# Patient Record
Sex: Male | Born: 1970 | Race: White | Hispanic: No | Marital: Married | Smoking: Never smoker
Health system: Southern US, Community
[De-identification: ages and names within clinical notes are randomized; demographics above are authoritative.]

## PROBLEM LIST (undated history)

## (undated) DIAGNOSIS — N2889 Other specified disorders of kidney and ureter: Secondary | ICD-10-CM

## (undated) DIAGNOSIS — C801 Malignant (primary) neoplasm, unspecified: Secondary | ICD-10-CM

## (undated) DIAGNOSIS — I10 Essential (primary) hypertension: Secondary | ICD-10-CM

## (undated) DIAGNOSIS — K219 Gastro-esophageal reflux disease without esophagitis: Secondary | ICD-10-CM

## (undated) DIAGNOSIS — G8921 Chronic pain due to trauma: Secondary | ICD-10-CM

## (undated) DIAGNOSIS — M1990 Unspecified osteoarthritis, unspecified site: Secondary | ICD-10-CM

## (undated) DIAGNOSIS — N319 Neuromuscular dysfunction of bladder, unspecified: Secondary | ICD-10-CM

## (undated) HISTORY — PX: URETHROPLASTY: SHX499

## (undated) HISTORY — PX: KIDNEY SURGERY: SHX687

---

## 2013-04-11 LAB — URINALYSIS, COMPLETE
Bilirubin,UR: NEGATIVE
Nitrite: NEGATIVE
Ph: 6 (ref 4.5–8.0)
Protein: NEGATIVE
RBC,UR: <1 /HPF (ref 0–5)
Specific Gravity: 1.006 (ref 1.003–1.030)
WBC UR: 36 /HPF (ref 0–5)

## 2013-04-11 LAB — CBC WITH DIFFERENTIAL/PLATELET
Eosinophil #: 0 10*3/uL (ref 0.0–0.7)
Eosinophil %: 0 %
HCT: 43.7 % (ref 40.0–52.0)
HGB: 14.9 g/dL (ref 13.0–18.0)
Lymphocyte #: 1 10*3/uL (ref 1.0–3.6)
MCH: 29.8 pg (ref 26.0–34.0)
MCV: 87 fL (ref 80–100)
Monocyte #: 1.4 x10 3/mm — ABNORMAL HIGH (ref 0.2–1.0)
Monocyte %: 5 %
Platelet: 223 10*3/uL (ref 150–440)
RBC: 5.02 10*6/uL (ref 4.40–5.90)
RDW: 13.4 % (ref 11.5–14.5)
WBC: 28.6 10*3/uL — ABNORMAL HIGH (ref 3.8–10.6)

## 2013-04-11 LAB — COMPREHENSIVE METABOLIC PANEL
Albumin: 3.9 g/dL (ref 3.4–5.0)
Alkaline Phosphatase: 124 U/L (ref 50–136)
Chloride: 99 mmol/L (ref 98–107)
Creatinine: 1.08 mg/dL (ref 0.60–1.30)
EGFR (African American): >60
EGFR (Non-African Amer.): >60
Glucose: 128 mg/dL — ABNORMAL HIGH (ref 65–99)
Osmolality: 265 (ref 275–301)
SGOT(AST): 27 U/L (ref 15–37)
SGPT (ALT): 39 U/L (ref 12–78)

## 2013-04-12 ENCOUNTER — Inpatient Hospital Stay: Payer: Self-pay | Admitting: Urology

## 2013-04-12 LAB — CBC WITH DIFFERENTIAL/PLATELET
Basophil #: 0.1 10*3/uL (ref 0.0–0.1)
Basophil %: 0.3 %
Eosinophil %: 0.5 %
HCT: 43.4 % (ref 40.0–52.0)
MCHC: 34.4 g/dL (ref 32.0–36.0)
MCV: 87 fL (ref 80–100)
Monocyte #: 1.3 x10 3/mm — ABNORMAL HIGH (ref 0.2–1.0)
Monocyte %: 6.4 %
Neutrophil #: 16.9 10*3/uL — ABNORMAL HIGH (ref 1.4–6.5)
Neutrophil %: 85.2 %
RDW: 13.5 % (ref 11.5–14.5)
WBC: 19.8 10*3/uL — ABNORMAL HIGH (ref 3.8–10.6)

## 2013-04-13 LAB — URINE CULTURE

## 2013-04-17 LAB — CULTURE, BLOOD (SINGLE)

## 2014-05-30 ENCOUNTER — Ambulatory Visit: Payer: Self-pay | Admitting: Urology

## 2014-06-21 ENCOUNTER — Emergency Department: Payer: Self-pay | Admitting: Emergency Medicine

## 2014-06-21 LAB — CBC
HCT: 42.9 % (ref 40.0–52.0)
HGB: 14.7 g/dL (ref 13.0–18.0)
MCH: 30.7 pg (ref 26.0–34.0)
MCHC: 34.3 g/dL (ref 32.0–36.0)
MCV: 90 fL (ref 80–100)
PLATELETS: 230 10*3/uL (ref 150–440)
RBC: 4.79 10*6/uL (ref 4.40–5.90)
RDW: 13.6 % (ref 11.5–14.5)
WBC: 9.2 10*3/uL (ref 3.8–10.6)

## 2014-06-21 LAB — BASIC METABOLIC PANEL
Anion Gap: 10 (ref 7–16)
BUN: 12 mg/dL (ref 7–18)
CALCIUM: 8.6 mg/dL (ref 8.5–10.1)
CREATININE: 1 mg/dL (ref 0.60–1.30)
Chloride: 105 mmol/L (ref 98–107)
Co2: 25 mmol/L (ref 21–32)
EGFR (African American): >60
EGFR (Non-African Amer.): >60
GLUCOSE: 109 mg/dL — AB (ref 65–99)
Osmolality: 280 (ref 275–301)
POTASSIUM: 3.6 mmol/L (ref 3.5–5.1)
SODIUM: 140 mmol/L (ref 136–145)

## 2014-06-21 LAB — TROPONIN I: Troponin-I: <0.02 ng/mL

## 2015-02-21 NOTE — Discharge Summary (Signed)
PATIENT NAME:  Austin Jennings, Austin Jennings MR#:  [id number] DATE OF BIRTH:  [birth year]  DATE OF ADMISSION:  04/12/2013 DATE OF DISCHARGE:  04/13/2013  ADMISSION DIAGNOSIS: Right epididymitis.  HISTORY OF PRESENT ILLNESS: A 44 year old male, who presented to the Emergency Department for a 12 hour history of right testicular pain, swelling and fever of 102 degrees. There is a previous history of recurrent epididymitis. Refer to the admission history and physical for details.   HOSPITAL COURSE: Scrotal sonogram was consistent with epididymo-orchitis. There was no evidence of abscess. He was started on IV Rocephin and anti-inflammatories. In the first 24 hours, his maximum temperature was 99.1. On the second hospital day, he had marked decrease and his testicular tenderness. WBC on admission was 28,000 and on the second hospital day, it was 19,000 and 10.6 thousand on the day of discharge. On the third postoperative day, he had been afebrile for 24 hours. His pain was markedly improved. Urine culture was growing 15,000 gram-negative rods.   DISPOSITION: The patient is discharged with instructions of no strenuous activity for 2 weeks. He was instructed to contact the office for increasing pain or recurrent fever greater than 101 degrees,  DISCHARGE MEDICATIONS:  Cefuroxime 500 mg p.o. b.i.d. x 21 days and Norco 5/325, 1 to 2 p.o. q. 4 to 6 hours p.r.n. pain.   DISCHARGE DIAGNOSIS: Right epididymitis.   CONDITION AT DISCHARGE: Good.   PROGNOSIS: Good.  FOLLOWUP: [facility], [place] in 3 weeks.  ____________________________ Ronda Fairly Bernardo Heater, MD scs:aw D: 04/13/2013 08:05:00 ET T: 04/13/2013 08:30:03 ET JOB#: [id number]  cc: Nicki Reaper C. Bernardo Heater, MD, <Dictator> Abbie Sons MD ELECTRONICALLY SIGNED 04/25/2013 13:18

## 2015-02-21 NOTE — Discharge Summary (Signed)
PATIENT NAME:  Austin Jennings, Austin Jennings MR#:  [id number] DATE OF BIRTH:  02-13-1971  DATE OF ADMISSION:  04/12/2013 DATE OF DISCHARGE:    ADMISSION DIAGNOSIS: Right epididymitis.  HISTORY OF PRESENT ILLNESS: A 44 year old male, who presented to the Emergency Department for a 12 hour history of right testicular pain, swelling and fever of 102 degrees. There is a previous history of recurrent epididymitis. Refer to the admission history and physical for details.   HOSPITAL COURSE: Scrotal sonogram was consistent with epididymo-orchitis. There was no evidence of abscess. He was started on IV Rocephin and anti-inflammatories. In the first 24 hours, his maximum temperature was 99.1. On the second hospital day, he had marked decrease and his testicular tenderness. WBC on admission was 28,000 and on the second hospital day, it was 19,000 and 10.6 thousand on the day of discharge. On the third postoperative day, he had been afebrile for 24 hours. His pain was markedly improved. Urine culture was growing 15,000 gram-negative rods.   DISPOSITION: The patient is discharged with instructions of no strenuous activity for 2 weeks. He was instructed to contact the office for increasing pain or recurrent fever greater than 101 degrees,  DISCHARGE MEDICATIONS:  Cefuroxime 500 mg p.o. b.i.d. x 21 days and Norco 5/325, 1 to 2 p.o. q. 4 to 6 hours p.r.n. pain.   DISCHARGE DIAGNOSIS: Right epididymitis.   CONDITION AT DISCHARGE: Good.   PROGNOSIS: Good.  FOLLOWUP: [facility], [place] in 3 weeks.  ____________________________ Ronda Fairly Bernardo Heater, MD scs:aw D: 04/13/2013 08:05:36 ET T: 04/13/2013 08:30:03 ET JOB#: [id number]  cc: Nicki Reaper C. Bernardo Heater, MD, <Dictator>
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
## 2017-09-21 ENCOUNTER — Emergency Department: Payer: 59

## 2017-09-21 ENCOUNTER — Other Ambulatory Visit: Payer: Self-pay

## 2017-09-21 ENCOUNTER — Encounter: Payer: Self-pay | Admitting: Emergency Medicine

## 2017-09-21 ENCOUNTER — Emergency Department
Admission: EM | Admit: 2017-09-21 | Discharge: 2017-09-21 | Disposition: A | Discharge status: Home / Self Care | Payer: 59 | Attending: Emergency Medicine | Admitting: Emergency Medicine

## 2017-09-21 DIAGNOSIS — N3000 Acute cystitis without hematuria: Secondary | ICD-10-CM

## 2017-09-21 DIAGNOSIS — N453 Epididymo-orchitis: Secondary | ICD-10-CM | POA: Diagnosis not present

## 2017-09-21 DIAGNOSIS — N5089 Other specified disorders of the male genital organs: Secondary | ICD-10-CM

## 2017-09-21 DIAGNOSIS — I10 Essential (primary) hypertension: Secondary | ICD-10-CM | POA: Diagnosis not present

## 2017-09-21 DIAGNOSIS — N50812 Left testicular pain: Secondary | ICD-10-CM | POA: Diagnosis present

## 2017-09-21 DIAGNOSIS — N50819 Testicular pain, unspecified: Secondary | ICD-10-CM

## 2017-09-21 HISTORY — DX: Other specified disorders of kidney and ureter: N28.89

## 2017-09-21 HISTORY — DX: Essential (primary) hypertension: I10

## 2017-09-21 LAB — URINALYSIS, COMPLETE (UACMP) WITH MICROSCOPIC
Bilirubin Urine: NEGATIVE
Glucose, UA: NEGATIVE mg/dL
Ketones, ur: NEGATIVE mg/dL
Nitrite: POSITIVE — AB
PROTEIN: 100 mg/dL — AB
SQUAMOUS EPITHELIAL / LPF: NONE SEEN
Specific Gravity, Urine: 1.02 (ref 1.005–1.030)
pH: 5 (ref 5.0–8.0)

## 2017-09-21 MED ORDER — OXYCODONE-ACETAMINOPHEN 5-325 MG PO TABS: 1.0000 | ORAL_TABLET | ORAL | 0 refills | Status: DC | PRN

## 2017-09-21 MED ORDER — OXYCODONE-ACETAMINOPHEN 5-325 MG PO TABS
1.0000 | ORAL_TABLET | Freq: Once | ORAL | Status: AC
  Administered 2017-09-21: 1 via ORAL
  Filled 2017-09-21: qty 1

## 2017-09-21 MED ORDER — LEVOFLOXACIN 500 MG PO TABS: 500.0000 mg | ORAL_TABLET | Freq: Every day | ORAL | 0 refills | Status: AC

## 2017-09-21 MED ORDER — KETOROLAC TROMETHAMINE 10 MG PO TABS: 10.0000 mg | ORAL_TABLET | Freq: Four times a day (QID) | ORAL | 0 refills | Status: DC | PRN

## 2017-09-21 MED ORDER — LEVOFLOXACIN 500 MG PO TABS
500.0000 mg | ORAL_TABLET | Freq: Once | ORAL | Status: AC
  Administered 2017-09-21: 500 mg via ORAL
  Filled 2017-09-21: qty 1

## 2017-09-21 NOTE — ED Provider Notes (Signed)
[facility] Emergency Department Provider Note    First MD Initiated Contact with Patient 09/21/17 [phone]     (approximate)  I have reviewed the triage vital signs and the nursing notes.   HISTORY  Chief Complaint Testicle Pain   HPI VEDH PTACEK is a 46 y.o. male with history of right epididymitis with right epididymis surgical removal presents to the emergency department with left testicular pain and swelling that is currently 7 out of 10 following "sitting on his testicle on Sunday.  Patient states the pain has been persistent and worsened today.  Patient denies any fever.  Patient denies any dysuria.  Patient states symptoms consistent with previous episode of epididymitis.   Past Medical History:  Diagnosis Date  . Hypertension   . Renal mass     There are no active problems to display for this patient.   Past Surgical History:  Procedure Laterality Date  . KIDNEY SURGERY Left    removal  . URETHROPLASTY      Prior to Admission medications   Medication Sig Start Date End Date Taking? Authorizing Provider  ketorolac (TORADOL) 10 MG tablet Take 1 tablet (10 mg total) by mouth every 6 (six) hours as needed. 09/21/17   Gregor Hams, MD  levofloxacin (LEVAQUIN) 500 MG tablet Take 1 tablet (500 mg total) by mouth daily for 7 days. 09/21/17 09/28/17  Gregor Hams, MD    Allergies No known drug allergies No family history on file.  Social History Social History   Tobacco Use  . Smoking status: Not on file  Substance Use Topics  . Alcohol use: Not on file  . Drug use: Not on file    Review of Systems Constitutional: No fever/chills Eyes: No visual changes. ENT: No sore throat. Cardiovascular: Denies chest pain. Respiratory: Denies shortness of breath. Gastrointestinal: No abdominal pain.  No nausea, no vomiting.  No diarrhea.  No constipation. Genitourinary: Negative for dysuria.  Positive for left testicular  pain Musculoskeletal: Negative for neck pain.  Negative for back pain. Integumentary: Negative for rash. Neurological: Negative for headaches, focal weakness or numbness.   ____________________________________________   PHYSICAL EXAM:  VITAL SIGNS: ED Triage Vitals  Enc Vitals Group     BP 09/21/17 0219 (!) 162/100     Pulse Rate 09/21/17 0219 96     Resp 09/21/17 0219 20     Temp 09/21/17 0219 99.8 F (37.7 C)     Temp Source 09/21/17 0219 Oral     SpO2 09/21/17 0219 97 %     Weight 09/21/17 0217 (!) 149.7 kg (330 lb)     Height 09/21/17 0217 1.854 m (6\' 1" )     Head Circumference --      Peak Flow --      Pain Score 09/21/17 0217 5     Pain Loc --      Pain Edu? --      Excl. in [place]? --     Constitutional: Alert and oriented. Well appearing and in no acute distress. Eyes: Conjunctivae are normal.  Head: Atraumatic. Mouth/Throat: Mucous membranes are moist. Oropharynx non-erythematous. Neck: No stridor.   Cardiovascular: Normal rate, regular rhythm. Good peripheral circulation. Grossly normal heart sounds. Respiratory: Normal respiratory effort.  No retractions. Lungs CTAB. Gastrointestinal: Soft and nontender. No distention.   Genitourinary: Left testicular swelling and tenderness to palpation. Musculoskeletal: No lower extremity tenderness nor edema. No gross deformities of extremities. Neurologic:  Normal speech and language. No  gross focal neurologic deficits are appreciated.  Skin:  Skin is warm, dry and intact. No rash noted.   ____________________________________________   LABS (all labs ordered are listed, but only abnormal results are displayed)  Labs Reviewed  URINALYSIS, COMPLETE (UACMP) WITH MICROSCOPIC - Abnormal; Notable for the following components:      Result Value   Color, Urine YELLOW (*)    APPearance HAZY (*)    Hgb urine dipstick SMALL (*)    Protein, ur 100 (*)    Nitrite POSITIVE (*)    Leukocytes, UA LARGE (*)    Bacteria, UA MANY  (*)    All other components within normal limits    RADIOLOGY I, McKittrick N Malcomb Gangemi, personally viewed and evaluated these images (plain radiographs) as part of my medical decision making, as well as reviewing the written report by the radiologist.  US Scrotum  Result Date: 09/21/2017 CLINICAL DATA:  Scrotal swelling for 3 days EXAM: SCROTAL ULTRASOUND DOPPLER ULTRASOUND OF THE TESTICLES TECHNIQUE: Complete ultrasound examination of the testicles, epididymis, and other scrotal structures was performed. Color and spectral Doppler ultrasound were also utilized to evaluate blood flow to the testicles. COMPARISON:  None. FINDINGS: Right testicle Measurements: 4.3 x 2.2 x 3.5 cm. Parenchymal echotexture is somewhat heterogeneous likely representing prominent Reedy testis. Old injury and scarring could also have this appearance. No discrete mass lesion. Homogeneous flow with color flow Doppler. Left testicle Measurements: 4.1 x 3.3 x 3.3 cm. No mass or microlithiasis visualized. Right epididymis:  Surgically absent. Left epididymis:  Small epididymal cysts or spermatoceles. Hydrocele:  Small left hydrocele. Varicocele:  Small left varicocele. Pulsed Doppler interrogation of both testes demonstrates normal low resistance arterial and venous waveforms bilaterally. Imaging obtained of the inguinal canals demonstrates fat in the upper left scrotum. No motion with Valsalva. No definite herniation. This could represent herniated fat, normal fatty prominence, or lipoma. IMPRESSION: 1. No evidence of testicular mass, torsion, or inflammation. 2. Small left hydrocele and small left varicocele. 3. Nonspecific fat demonstrated in the left inguinal canal. Electronically Signed   By: Lucienne Capers M.D.   On: 09/21/2017 03:40   [place] Scrotom Doppler  Result Date: 09/21/2017 CLINICAL DATA:  Scrotal swelling for 3 days EXAM: SCROTAL ULTRASOUND DOPPLER ULTRASOUND OF THE TESTICLES TECHNIQUE: Complete ultrasound examination of  the testicles, epididymis, and other scrotal structures was performed. Color and spectral Doppler ultrasound were also utilized to evaluate blood flow to the testicles. COMPARISON:  None. FINDINGS: Right testicle Measurements: 4.3 x 2.2 x 3.5 cm. Parenchymal echotexture is somewhat heterogeneous likely representing prominent Reedy testis. Old injury and scarring could also have this appearance. No discrete mass lesion. Homogeneous flow with color flow Doppler. Left testicle Measurements: 4.1 x 3.3 x 3.3 cm. No mass or microlithiasis visualized. Right epididymis:  Surgically absent. Left epididymis:  Small epididymal cysts or spermatoceles. Hydrocele:  Small left hydrocele. Varicocele:  Small left varicocele. Pulsed Doppler interrogation of both testes demonstrates normal low resistance arterial and venous waveforms bilaterally. Imaging obtained of the inguinal canals demonstrates fat in the upper left scrotum. No motion with Valsalva. No definite herniation. This could represent herniated fat, normal fatty prominence, or lipoma. IMPRESSION: 1. No evidence of testicular mass, torsion, or inflammation. 2. Small left hydrocele and small left varicocele. 3. Nonspecific fat demonstrated in the left inguinal canal. Electronically Signed   By: Lucienne Capers M.D.   On: 09/21/2017 03:40     Procedures   ____________________________________________   INITIAL IMPRESSION / ASSESSMENT AND  PLAN / ED COURSE  As part of my medical decision making, I reviewed the following data within the electronic MEDICAL RECORD [id number] year old male presented with above-stated history and physical exam secondary to left testicular pain.  Suspect possible left traumatic epididymoorchitis.  Ultrasound revealed no evidence of mass torsion or inflammation as per radiologist however clinical exam consistent with possible epididymal orchitis.  Urinalysis consistent with a urinary tract infection patient given Levaquin in the emergency  department will be prescribed the same for home ____________________________________________  FINAL CLINICAL IMPRESSION(S) / ED DIAGNOSES  Final diagnoses:  Testicle pain  Scrotum swelling     MEDICATIONS GIVEN DURING THIS VISIT:  Medications - No data to display   ED Discharge Orders        Ordered    levofloxacin (LEVAQUIN) 500 MG tablet  Daily     09/21/17 0403    ketorolac (TORADOL) 10 MG tablet  Every 6 hours PRN     09/21/17 0403       Note:  This document was prepared using Dragon voice recognition software and may include unintentional dictation errors.    Gregor Hams, MD 09/21/17 [phone]

## 2017-09-21 NOTE — ED Triage Notes (Signed)
Patient ambulatory to triage with steady gait, without difficulty or distress noted; pt reports left testicular swelling/pain since Sunday after sitting on it; st hx of epidymitis

## 2017-09-21 NOTE — ED Notes (Signed)
Received a call from pt pharmacy stating pt just filled a Rx for hydrocodone that he gets monthly and was concerned that we Rx percocet on top of his normal pain medication, consulted with Dr. Archie Balboa about the concerns and he stated to cancel the Rx of percocet that was Rx today.

## 2021-05-26 ENCOUNTER — Other Ambulatory Visit: Payer: Self-pay | Admitting: Unknown Physician Specialty

## 2021-05-26 DIAGNOSIS — D110 Benign neoplasm of parotid gland: Secondary | ICD-10-CM

## 2021-06-02 ENCOUNTER — Other Ambulatory Visit: Payer: Self-pay

## 2021-06-02 ENCOUNTER — Ambulatory Visit
Admission: RE | Admit: 2021-06-02 | Discharge: 2021-06-02 | Disposition: A | Discharge status: Home / Self Care | Payer: Managed Care, Other (non HMO) | Source: Ambulatory Visit | Attending: Unknown Physician Specialty | Admitting: Unknown Physician Specialty

## 2021-06-02 DIAGNOSIS — D110 Benign neoplasm of parotid gland: Secondary | ICD-10-CM | POA: Insufficient documentation

## 2021-06-02 LAB — POCT I-STAT CREATININE: Creatinine, Ser: 1 mg/dL (ref 0.61–1.24)

## 2021-06-02 MED ORDER — IOHEXOL 350 MG/ML SOLN
75.0000 mL | Freq: Once | INTRAVENOUS | Status: AC | PRN
  Administered 2021-06-02: 75 mL via INTRAVENOUS

## 2021-06-04 ENCOUNTER — Ambulatory Visit: Admission: RE | Admit: 2021-06-04 | Payer: 59 | Source: Ambulatory Visit

## 2021-06-09 ENCOUNTER — Other Ambulatory Visit: Payer: Self-pay | Admitting: Unknown Physician Specialty

## 2021-06-09 DIAGNOSIS — K118 Other diseases of salivary glands: Secondary | ICD-10-CM

## 2021-06-17 ENCOUNTER — Other Ambulatory Visit: Payer: Self-pay | Admitting: Radiology

## 2021-06-18 ENCOUNTER — Ambulatory Visit
Admission: RE | Admit: 2021-06-18 | Discharge: 2021-06-18 | Disposition: A | Discharge status: Home / Self Care | Payer: Managed Care, Other (non HMO) | Source: Ambulatory Visit | Attending: Unknown Physician Specialty | Admitting: Unknown Physician Specialty

## 2021-06-18 ENCOUNTER — Ambulatory Visit: Admission: RE | Admit: 2021-06-18 | Payer: Managed Care, Other (non HMO) | Source: Ambulatory Visit

## 2021-06-18 ENCOUNTER — Other Ambulatory Visit: Payer: Self-pay

## 2021-06-18 DIAGNOSIS — D490 Neoplasm of unspecified behavior of digestive system: Secondary | ICD-10-CM | POA: Insufficient documentation

## 2021-06-18 DIAGNOSIS — K118 Other diseases of salivary glands: Secondary | ICD-10-CM | POA: Insufficient documentation

## 2021-06-18 NOTE — Procedures (Signed)
Interventional Radiology Procedure:   Indications: Left parotid neoplasm  Procedure: US guided core biopsy of left parotid lesion   Findings: Hypoechoic left parotid lesion.  Core specimens obtained.  Complications: No immediate complications noted.     EBL: Minimal  Plan: Discharge to home    [place]. Anselm Pancoast, MD  Pager: [phone]

## 2021-06-19 LAB — SURGICAL PATHOLOGY

## 2021-07-08 ENCOUNTER — Encounter
Admission: RE | Admit: 2021-07-08 | Discharge: 2021-07-08 | Disposition: A | Discharge status: Home / Self Care | Payer: Managed Care, Other (non HMO) | Source: Ambulatory Visit | Attending: Unknown Physician Specialty | Admitting: Unknown Physician Specialty

## 2021-07-08 ENCOUNTER — Other Ambulatory Visit: Payer: Self-pay

## 2021-07-08 HISTORY — DX: Chronic pain due to trauma: G89.21

## 2021-07-08 HISTORY — DX: Unspecified osteoarthritis, unspecified site: M19.90

## 2021-07-08 HISTORY — DX: Gastro-esophageal reflux disease without esophagitis: K21.9

## 2021-07-08 HISTORY — DX: Neuromuscular dysfunction of bladder, unspecified: N31.9

## 2021-07-08 HISTORY — DX: Malignant (primary) neoplasm, unspecified: C80.1

## 2021-07-08 NOTE — Patient Instructions (Signed)
Your procedure is scheduled on:07-14-21 Tuesday Report to the Registration Desk on the 1st floor of the [facility].Then proceed to the 2nd floor Surgery Desk in the [facility] To find out your arrival time, please call [phone] between 1PM - 3PM on:07-13-21 Monday  REMEMBER: Instructions that are not followed completely may result in serious medical risk, up to and including death; or upon the discretion of your surgeon and anesthesiologist your surgery may need to be rescheduled.  Do not eat food after midnight the night before surgery.  No gum chewing, lozengers or hard candies.  You may however, drink CLEAR liquids up to 2 hours before you are scheduled to arrive for your surgery. Do not drink anything within 2 hours of your scheduled arrival time.  Clear liquids include: - water  - apple juice without pulp - gatorade (not RED, PURPLE, OR BLUE) - black coffee or tea (Do NOT add milk or creamers to the coffee or tea) Do NOT drink anything that is not on this list.  TAKE THESE MEDICATIONS THE MORNING OF SURGERY WITH A SIP OF WATER: -Allopurinol (Zyloprim) -Hydrocodone (Norco)  One week prior to surgery: Stop Anti-inflammatories (NSAIDS) such as Advil, Aleve, Ibuprofen, Motrin, Naproxen, Naprosyn and Aspirin based products such as Excedrin, Goodys Powder, BC Powder.You may however, take Tylenol if needed for pain up until the day of surgery.  Stop ANY OVER THE COUNTER supplements/vitamins NOW (07-08-21) until after surgery.  No Alcohol for 24 hours before or after surgery.  No Smoking including e-cigarettes for 24 hours prior to surgery.  No chewable tobacco products for at least 6 hours prior to surgery.  No nicotine patches on the day of surgery.  Do not use any "recreational" drugs for at least a week prior to your surgery.  Please be advised that the combination of cocaine and anesthesia may have negative outcomes, up to and including death. If you test positive for  cocaine, your surgery will be cancelled.  On the morning of surgery brush your teeth with toothpaste and water, you may rinse your mouth with mouthwash if you wish. Do not swallow any toothpaste or mouthwash.  Do not wear jewelry, make-up, hairpins, clips or nail polish.  Do not wear lotions, powders, or perfumes.   Do not shave body from the neck down 48 hours prior to surgery just in case you cut yourself which could leave a site for infection.  Also, freshly shaved skin may become irritated if using the CHG soap.  Contact lenses, hearing aids and dentures may not be worn into surgery.  Do not bring valuables to the hospital. [facility] is not responsible for any missing/lost belongings or valuables.   Use CHG Soap as directed on instruction sheet.  Notify your doctor if there is any change in your medical condition (cold, fever, infection).  Wear comfortable clothing (specific to your surgery type) to the hospital.  After surgery, you can help prevent lung complications by doing breathing exercises.  Take deep breaths and cough every 1-2 hours. Your doctor may order a device called an Incentive Spirometer to help you take deep breaths. When coughing or sneezing, hold a pillow firmly against your incision with both hands. This is called "splinting." Doing this helps protect your incision. It also decreases belly discomfort.  If you are being admitted to the hospital overnight, leave your suitcase in the car. After surgery it may be brought to your room.  If you are being discharged the day of  surgery, you will not be allowed to drive home. You will need a responsible adult (18 years or older) to drive you home and stay with you that night.   If you are taking public transportation, you will need to have a responsible adult (18 years or older) with you. Please confirm with your physician that it is acceptable to use public transportation.   Please call the [facility]  Dept. at [phone] if you have any questions about these instructions.  Surgery Visitation Policy:  Patients undergoing a surgery or procedure may have one family member or support person with them as long as that person is not COVID-19 positive or experiencing its symptoms.  That person may remain in the waiting area during the procedure.  Inpatient Visitation:    Visiting hours are 7 a.m. to 8 p.m. Inpatients will be allowed two visitors daily. The visitors may change each day during the patient's stay. No visitors under the age of 50. Any visitor under the age of 17 must be accompanied by an adult. The visitor must pass COVID-19 screenings, use hand sanitizer when entering and exiting the patient's room and wear a mask at all times, including in the patient's room. Patients must also wear a mask when staff or their visitor are in the room. Masking is required regardless of vaccination status.

## 2021-07-10 ENCOUNTER — Encounter
Admission: RE | Admit: 2021-07-10 | Discharge: 2021-07-10 | Disposition: A | Discharge status: Home / Self Care | Payer: Managed Care, Other (non HMO) | Source: Ambulatory Visit | Attending: Unknown Physician Specialty | Admitting: Unknown Physician Specialty

## 2021-07-10 ENCOUNTER — Other Ambulatory Visit: Payer: Self-pay

## 2021-07-10 DIAGNOSIS — Z01818 Encounter for other preprocedural examination: Secondary | ICD-10-CM | POA: Diagnosis present

## 2021-07-10 LAB — POTASSIUM: Potassium: 4.2 mmol/L (ref 3.5–5.1)

## 2021-07-14 ENCOUNTER — Ambulatory Visit
Admission: RE | Admit: 2021-07-14 | Discharge: 2021-07-14 | Disposition: A | Discharge status: Home / Self Care | Payer: Managed Care, Other (non HMO) | Attending: Unknown Physician Specialty | Admitting: Unknown Physician Specialty

## 2021-07-14 ENCOUNTER — Encounter: Payer: Self-pay | Admitting: Unknown Physician Specialty

## 2021-07-14 ENCOUNTER — Ambulatory Visit: Payer: Managed Care, Other (non HMO) | Admitting: Urgent Care

## 2021-07-14 ENCOUNTER — Other Ambulatory Visit: Payer: Self-pay

## 2021-07-14 ENCOUNTER — Ambulatory Visit: Payer: Managed Care, Other (non HMO)

## 2021-07-14 ENCOUNTER — Encounter
Admission: RE | Disposition: A | Discharge status: Home / Self Care | Payer: Self-pay | Source: Home / Self Care | Attending: Unknown Physician Specialty

## 2021-07-14 DIAGNOSIS — D110 Benign neoplasm of parotid gland: Secondary | ICD-10-CM | POA: Insufficient documentation

## 2021-07-14 DIAGNOSIS — Z79899 Other long term (current) drug therapy: Secondary | ICD-10-CM | POA: Insufficient documentation

## 2021-07-14 DIAGNOSIS — Z905 Acquired absence of kidney: Secondary | ICD-10-CM | POA: Insufficient documentation

## 2021-07-14 DIAGNOSIS — Z85528 Personal history of other malignant neoplasm of kidney: Secondary | ICD-10-CM | POA: Insufficient documentation

## 2021-07-14 DIAGNOSIS — D37030 Neoplasm of uncertain behavior of the parotid salivary glands: Secondary | ICD-10-CM | POA: Diagnosis present

## 2021-07-14 HISTORY — PX: PAROTIDECTOMY: SHX2163

## 2021-07-14 SURGERY — EXCISION, PAROTID GLAND
Anesthesia: General | Laterality: Left

## 2021-07-14 MED ORDER — CEPHALEXIN 500 MG PO CAPS: 500.0000 mg | ORAL_CAPSULE | Freq: Three times a day (TID) | ORAL | 0 refills | Status: AC

## 2021-07-14 MED ORDER — CHLORHEXIDINE GLUCONATE 0.12 % MT SOLN
15.0000 mL | Freq: Once | OROMUCOSAL | Status: AC
  Administered 2021-07-14: 15 mL via OROMUCOSAL

## 2021-07-14 MED ORDER — PROPOFOL 10 MG/ML IV BOLUS
INTRAVENOUS | Status: AC
  Filled 2021-07-14: qty 20

## 2021-07-14 MED ORDER — OXYCODONE-ACETAMINOPHEN 10-325 MG PO TABS: 1.0000 | ORAL_TABLET | Freq: Four times a day (QID) | ORAL | 0 refills | Status: AC | PRN

## 2021-07-14 MED ORDER — REMIFENTANIL HCL 1 MG IV SOLR
INTRAVENOUS | Status: AC
  Filled 2021-07-14: qty 1000

## 2021-07-14 MED ORDER — SUCCINYLCHOLINE CHLORIDE 200 MG/10ML IV SOSY
PREFILLED_SYRINGE | INTRAVENOUS | Status: AC
  Filled 2021-07-14: qty 10

## 2021-07-14 MED ORDER — EPHEDRINE SULFATE 50 MG/ML IJ SOLN
INTRAMUSCULAR | Status: DC | PRN
Start: 2021-07-14 — End: 2021-07-14
  Administered 2021-07-14: 10 mg via INTRAVENOUS
  Administered 2021-07-14: 5 mg via INTRAVENOUS

## 2021-07-14 MED ORDER — ONDANSETRON HCL 4 MG/2ML IJ SOLN
INTRAMUSCULAR | Status: DC | PRN
  Administered 2021-07-14: 4 mg via INTRAVENOUS

## 2021-07-14 MED ORDER — PHENYLEPHRINE HCL (PRESSORS) 10 MG/ML IV SOLN
INTRAVENOUS | Status: DC | PRN
  Administered 2021-07-14: 200 ug via INTRAVENOUS
  Administered 2021-07-14 (×2): 100 ug via INTRAVENOUS
  Administered 2021-07-14: 50 ug via INTRAVENOUS

## 2021-07-14 MED ORDER — FENTANYL CITRATE (PF) 100 MCG/2ML IJ SOLN
INTRAMUSCULAR | Status: AC
  Filled 2021-07-14: qty 2

## 2021-07-14 MED ORDER — SODIUM CHLORIDE 0.9 % IV SOLN
INTRAVENOUS | Status: DC | PRN
  Administered 2021-07-14: 50 ug/min via INTRAVENOUS

## 2021-07-14 MED ORDER — DEXMEDETOMIDINE HCL IN NACL 200 MCG/50ML IV SOLN
INTRAVENOUS | Status: AC
  Filled 2021-07-14: qty 50

## 2021-07-14 MED ORDER — CHLORHEXIDINE GLUCONATE 0.12 % MT SOLN
OROMUCOSAL | Status: AC
  Filled 2021-07-14: qty 15

## 2021-07-14 MED ORDER — LIDOCAINE-EPINEPHRINE 1 %-1:100000 IJ SOLN
INTRAMUSCULAR | Status: DC | PRN
  Administered 2021-07-14: 5 mL

## 2021-07-14 MED ORDER — DEXMEDETOMIDINE (PRECEDEX) IN NS 20 MCG/5ML (4 MCG/ML) IV SYRINGE
PREFILLED_SYRINGE | INTRAVENOUS | Status: DC | PRN
  Administered 2021-07-14: 12 ug via INTRAVENOUS
  Administered 2021-07-14 (×2): 4 ug via INTRAVENOUS

## 2021-07-14 MED ORDER — DEXAMETHASONE SODIUM PHOSPHATE 10 MG/ML IJ SOLN
INTRAMUSCULAR | Status: DC | PRN
  Administered 2021-07-14: 5 mg via INTRAVENOUS

## 2021-07-14 MED ORDER — FENTANYL CITRATE (PF) 100 MCG/2ML IJ SOLN
INTRAMUSCULAR | Status: DC | PRN
  Administered 2021-07-14 (×2): 50 ug via INTRAVENOUS

## 2021-07-14 MED ORDER — LACTATED RINGERS IV SOLN: INTRAVENOUS | Status: DC

## 2021-07-14 MED ORDER — PHENYLEPHRINE HCL (PRESSORS) 10 MG/ML IV SOLN
INTRAVENOUS | Status: AC
  Filled 2021-07-14: qty 1

## 2021-07-14 MED ORDER — FAMOTIDINE 20 MG PO TABS
ORAL_TABLET | ORAL | Status: AC
  Administered 2021-07-14: 20 mg via ORAL
  Filled 2021-07-14: qty 1

## 2021-07-14 MED ORDER — SODIUM CHLORIDE 0.9 % IV SOLN
INTRAVENOUS | Status: DC | PRN
  Administered 2021-07-14: .05 ug/kg/min via INTRAVENOUS

## 2021-07-14 MED ORDER — GLYCOPYRROLATE 0.2 MG/ML IJ SOLN
INTRAMUSCULAR | Status: DC | PRN
  Administered 2021-07-14 (×2): .1 mg via INTRAVENOUS

## 2021-07-14 MED ORDER — MIDAZOLAM HCL 2 MG/2ML IJ SOLN
INTRAMUSCULAR | Status: AC
  Filled 2021-07-14: qty 2

## 2021-07-14 MED ORDER — LIDOCAINE HCL (CARDIAC) PF 100 MG/5ML IV SOSY
PREFILLED_SYRINGE | INTRAVENOUS | Status: DC | PRN
  Administered 2021-07-14: 100 mg via INTRAVENOUS

## 2021-07-14 MED ORDER — FAMOTIDINE 20 MG PO TABS: 20.0000 mg | ORAL_TABLET | Freq: Once | ORAL | Status: AC

## 2021-07-14 MED ORDER — MIDAZOLAM HCL 2 MG/2ML IJ SOLN
INTRAMUSCULAR | Status: DC | PRN
  Administered 2021-07-14: 2 mg via INTRAVENOUS

## 2021-07-14 MED ORDER — FENTANYL CITRATE (PF) 100 MCG/2ML IJ SOLN: 25.0000 ug | INTRAMUSCULAR | Status: DC | PRN

## 2021-07-14 MED ORDER — PROPOFOL 10 MG/ML IV BOLUS
INTRAVENOUS | Status: DC | PRN
  Administered 2021-07-14: 200 mg via INTRAVENOUS
  Administered 2021-07-14: 50 mg via INTRAVENOUS
  Administered 2021-07-14: 60 mg via INTRAVENOUS
  Administered 2021-07-14: 150 mg via INTRAVENOUS

## 2021-07-14 MED ORDER — PROMETHAZINE HCL 25 MG/ML IJ SOLN: 6.2500 mg | INTRAMUSCULAR | Status: DC | PRN

## 2021-07-14 MED ORDER — ORAL CARE MOUTH RINSE: 15.0000 mL | Freq: Once | OROMUCOSAL | Status: AC

## 2021-07-14 MED ORDER — LIDOCAINE HCL (PF) 2 % IJ SOLN
INTRAMUSCULAR | Status: AC
  Filled 2021-07-14: qty 5

## 2021-07-14 MED ORDER — SUCCINYLCHOLINE CHLORIDE 200 MG/10ML IV SOSY
PREFILLED_SYRINGE | INTRAVENOUS | Status: DC | PRN
  Administered 2021-07-14: 120 mg via INTRAVENOUS

## 2021-07-14 MED ORDER — 0.9 % SODIUM CHLORIDE (POUR BTL) OPTIME
TOPICAL | Status: DC | PRN
  Administered 2021-07-14: 200 mL

## 2021-07-14 MED ORDER — LIDOCAINE-EPINEPHRINE 1 %-1:100000 IJ SOLN
INTRAMUSCULAR | Status: AC
  Filled 2021-07-14: qty 1

## 2021-07-14 SURGICAL SUPPLY — 48 items
ADH LQ OCL WTPRF AMP STRL LF (MISCELLANEOUS) ×1
ADH SKN CLS APL DERMABOND .7 (GAUZE/BANDAGES/DRESSINGS) ×1
ADHESIVE MASTISOL STRL (MISCELLANEOUS) ×2 IMPLANT
BLADE SURG 15 STRL LF DISP TIS (BLADE) ×1 IMPLANT
BLADE SURG 15 STRL SS (BLADE) ×2
BULB RESERV EVAC DRAIN JP 100C (MISCELLANEOUS) ×1 IMPLANT
CORD BIP STRL DISP 12FT (MISCELLANEOUS) ×2 IMPLANT
DERMABOND ADVANCED (GAUZE/BANDAGES/DRESSINGS) ×1
DERMABOND ADVANCED .7 DNX12 (GAUZE/BANDAGES/DRESSINGS) ×1 IMPLANT
DRAIN JP 10F RND SILICONE (MISCELLANEOUS) ×2 IMPLANT
DRAPE MAG INST 16X20 L/F (DRAPES) ×2 IMPLANT
DRAPE SURG 17X11 SM STRL (DRAPES) ×2 IMPLANT
DRSG TEGADERM 2-3/8X2-3/4 SM (GAUZE/BANDAGES/DRESSINGS) ×6 IMPLANT
ELECT CAUTERY BLADE TIP 2.5 (TIP) ×2
ELECT EMG 20MM DUAL (MISCELLANEOUS) ×4
ELECT NEEDLE 20X.3 GREEN (MISCELLANEOUS) ×2
ELECT REM PT RETURN 9FT ADLT (ELECTROSURGICAL) ×2
ELECTRODE CAUTERY BLDE TIP 2.5 (TIP) ×1 IMPLANT
ELECTRODE EMG 20MM DUAL (MISCELLANEOUS) ×2 IMPLANT
ELECTRODE NDL 20X.3 GREEN (MISCELLANEOUS) ×1 IMPLANT
ELECTRODE NEEDLE 20X.3 GREEN (MISCELLANEOUS) ×1 IMPLANT
ELECTRODE REM PT RTRN 9FT ADLT (ELECTROSURGICAL) ×1 IMPLANT
FORCEPS JEWEL BIP 4-3/4 STR (INSTRUMENTS) ×2 IMPLANT
GAUZE 4X4 16PLY ~~LOC~~+RFID DBL (SPONGE) ×4 IMPLANT
GLOVE SURG ENC MOIS LTX SZ7.5 (GLOVE) ×4 IMPLANT
GOWN STRL REUS W/ TWL LRG LVL3 (GOWN DISPOSABLE) ×2 IMPLANT
GOWN STRL REUS W/TWL LRG LVL3 (GOWN DISPOSABLE) ×4
HEMOSTAT SURGICEL 2X3 (HEMOSTASIS) ×2 IMPLANT
HOOK STAY BLUNT/RETRACTOR 5M (MISCELLANEOUS) ×2 IMPLANT
KIT TURNOVER KIT A (KITS) ×2 IMPLANT
LABEL OR SOLS (LABEL) ×2 IMPLANT
MANIFOLD NEPTUNE II (INSTRUMENTS) ×2 IMPLANT
MARKER SKIN DUAL TIP RULER LAB (MISCELLANEOUS) ×2 IMPLANT
NS IRRIG 500ML POUR BTL (IV SOLUTION) ×2 IMPLANT
PACK HEAD/NECK (MISCELLANEOUS) ×2 IMPLANT
PROBE MONO 100X0.75 ELECT 1.9M (MISCELLANEOUS) ×2 IMPLANT
SHEARS HARMONIC 9CM CVD (BLADE) ×2 IMPLANT
SPONGE KITTNER 5P (MISCELLANEOUS) ×3 IMPLANT
STAPLER SKIN PROX 35W (STAPLE) ×2 IMPLANT
SUT PROLENE 4 0 PS 2 18 (SUTURE) ×1 IMPLANT
SUT PROLENE 5 0 PS 3 (SUTURE) ×2 IMPLANT
SUT SILK 2 0 (SUTURE) ×2
SUT SILK 2-0 30XBRD TIE 12 (SUTURE) ×1 IMPLANT
SUT SILK 3 0 (SUTURE) ×2
SUT SILK 3-0 18XBRD TIE 12 (SUTURE) ×1 IMPLANT
SUT VIC AB 4-0 RB1 18 (SUTURE) ×5 IMPLANT
SYSTEM CHEST DRAIN TLS 7FR (DRAIN) ×1 IMPLANT
WATER STERILE IRR 500ML POUR (IV SOLUTION) ×2 IMPLANT

## 2021-07-14 NOTE — Discharge Instructions (Signed)
AMBULATORY SURGERY  ?DISCHARGE INSTRUCTIONS ? ? ?The drugs that you were given will stay in your system until tomorrow so for the next 24 hours you should not: ? ?Drive an automobile ?Make any legal decisions ?Drink any alcoholic beverage ? ? ?You may resume regular meals tomorrow.  Today it is better to start with liquids and gradually work up to solid foods. ? ?You may eat anything you prefer, but it is better to start with liquids, then soup and crackers, and gradually work up to solid foods. ? ? ?Please notify your doctor immediately if you have any unusual bleeding, trouble breathing, redness and pain at the surgery site, drainage, fever, or pain not relieved by medication. ? ? ? ?Additional Instructions: ? ? ? ?Please contact your physician with any problems or Same Day Surgery at [phone], Monday through Friday 6 am to 4 pm, or [facility] Main number at [phone].  ?

## 2021-07-14 NOTE — Transfer of Care (Signed)
Immediate Anesthesia Transfer of Care Note  Patient: Austin Jennings  Procedure(s) Performed: PAROTIDECTOMY WITH FACIAL NERVE MONITORING (Left)  Patient Location: PACU  Anesthesia Type:General  Level of Consciousness: awake  Airway & Oxygen Therapy: Patient Spontanous Breathing and Patient connected to face mask oxygen  Post-op Assessment: Report given to RN and Post -op Vital signs reviewed and stable  Post vital signs: Reviewed and stable  Last Vitals:  Vitals Value Taken Time  BP 145/89 07/14/21 1010  Temp    Pulse 99 07/14/21 1014  Resp 22 07/14/21 1013  SpO2 96 % 07/14/21 1014  Vitals shown include unvalidated device data.  Last Pain:  Vitals:   07/14/21 0627  TempSrc: Oral         Complications: No notable events documented.

## 2021-07-14 NOTE — Progress Notes (Signed)
Report given to OR RN, and Nurse Anesthetist  that Urethral catheterization is  contraindicated to this patient. Patient has made Pre-op RN, OR RN and Nurse anesthetist to avoid catheterizing him. Let him void on his own.

## 2021-07-14 NOTE — Anesthesia Procedure Notes (Signed)
Procedure Name: Intubation Date/Time: 07/14/2021 7:55 AM Performed by: Theresa Mulligan, RN Pre-anesthesia Checklist: Patient identified, Patient being monitored, Timeout performed, Emergency Drugs available and Suction available Patient Re-evaluated:Patient Re-evaluated prior to induction Oxygen Delivery Method: Circle system utilized Preoxygenation: Pre-oxygenation with 100% oxygen Induction Type: IV induction Ventilation: Mask ventilation without difficulty and Oral airway inserted - appropriate to patient size Laryngoscope Size: Mac, 4 and McGraph Grade View: Grade I Tube type: Oral Tube size: 7.5 mm Number of attempts: 1 Airway Equipment and Method: Stylet and Video-laryngoscopy Placement Confirmation: ETT inserted through vocal cords under direct vision, positive ETCO2 and breath sounds checked- equal and bilateral Secured at: 24 cm Tube secured with: Tape Dental Injury: Teeth and Oropharynx as per pre-operative assessment

## 2021-07-14 NOTE — H&P (Signed)
..  History and Physical paper copy reviewed and updated date of procedure and will be scanned into system.  Patient seen and examined and marked.  

## 2021-07-14 NOTE — Progress Notes (Signed)
JP drainage reviewed with patient and patient's wife. Instructions to empty and maintain suction. Paperwork with instructions given to wife, instructed to bring paperwork with drainage amounts to appointment Thursday. Patient and wife able to demo back how to empty and maintain JP drain.
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
## 2021-07-14 NOTE — Anesthesia Postprocedure Evaluation (Signed)
Anesthesia Post Note  Patient: Austin Jennings  Procedure(s) Performed: PAROTIDECTOMY WITH FACIAL NERVE MONITORING (Left)  Patient location during evaluation: PACU Anesthesia Type: General Level of consciousness: awake and alert Pain management: pain level controlled Vital Signs Assessment: post-procedure vital signs reviewed and stable Respiratory status: spontaneous breathing, nonlabored ventilation, respiratory function stable and patient connected to nasal cannula oxygen Cardiovascular status: blood pressure returned to baseline and stable Postop Assessment: no apparent nausea or vomiting Anesthetic complications: no   No notable events documented.   Last Vitals:  Vitals:   07/14/21 1040 07/14/21 1101  BP: (!) 143/90 (!) 146/89  Pulse: 84 83  Resp: 17 20  Temp: (!) 36.4 C 36.6 C  SpO2: 96% 95%    Last Pain:  Vitals:   07/14/21 1101  TempSrc: Temporal  PainSc: 2                  Milissa Fesperman Doyne Keel

## 2021-07-14 NOTE — Op Note (Signed)
OPERATIVE REPORT  DATE OF SURGERY: 07/14/2021  PATIENT:  Austin Jennings,  50 y.o. male  PRE-OPERATIVE DIAGNOSIS:  Neoplasm uncertain behavior, parotid gland.  POST-OPERATIVE DIAGNOSIS:  Neoplasm uncertain behavior, parotid gland.  PROCEDURE:  Procedure(s): Left superficial PAROTIDECTOMY WITH FACIAL NERVE DISSECTION AND WITH FACIAL NERVE MONITORING 2 hours  SURGEON:  Roena Malady, MD   Asst: Vaught  Anes:  General  EBL: Less than 20 cc  Comp: None  Findings: 5 cm tumor left superficial lobe parotid  Procedure: Mr. House was identified in the holding area taken the operating placed in supine position.  After general trach anesthesia the table is turned 90 degrees facial nerve monitor was applied to the left face affixed to the nurse on monitor and remained on throughout the procedure.  The left face and neck were then prepped and draped sterilely.  An incision line was marked in the preauricular crease extending down onto the upper neck.  The parotid mass was easily palpated in this region.  A local anesthetic of 1% lidocaine with 1 1000 units of epinephrine was used to inject overlying the incision mark a total of 5 cc was used.  With the neck prepped and draped sterilely a 15 blade was used to incise down to the superficial muscular aponeurotic system.  Hemostasis was achieved using the Bovie cautery.  A supra SMAS flap was elevated out onto the face and posteriorly onto the neck.  Dural hooks were then placed for retention and holding.  The dissection proceeded in the pretragal area separating the fascia from the trach extending this down onto the neck.  The sternocleidomastoid muscle was identified inferiorly and the dissection proceeded superiorly in the pretragal crease.  There were multiple small feeding vessels which were divided using harmonic scalpel.  The facial nerve was identified as it exited the skull base this was stimulated and remained intact throughout the procedure.   The main trunk of the nerve was dissected laterally and the major divisions of the trunk were identified.  The mass was located in the lower aspect of the parotid gland therefore the inferior branch of the nerve was dissected and the superficial lobe of the parotid was transected overlying the nerve using the harmonic scalpel.  The nerve was then traced more distally and the gland was dissected off of the nerve.  Again this was performed using the harmonic scalpel.  The mass was easily palpated it was separated from the sternocleidomastoid muscle inferiorly there were multiple feeding vessels into the mass which were divided using harmonic scalpel.  The greater regular nerve was identified but was running right into the mass therefore we were not able to salvage the nerve and this was transected using the harmonic scalpel.  The mass was dissected inferiorly and released from the deep lobe and the underlying tissues dissection was carried along the tumor medially removing the mass from the underlying soft tissues.  The mass measured approximately 5 cm.  With the mass removed in its entirety wound was copiously irrigated with saline.  The nerve stimulator was brought back into the field the main trunk and each division of the nerve was stimulated and all remained intact.  Several small bleeding vessels were cauterized using the microbipolar.  With no active bleeding a #10 TLS drain was brought out the wound inferiorly Surgicel was placed within the wound bed.  The SMAS layer was reapproximated using 4-0 Vicryl the subcutaneous tissues were closed using 4-0 Vicryl and the skin was  reapproximated using Dermabond.  A 4-0 Prolene was used to fix the drain to prevent its removal.  With the procedure completed the patient was returned to anesthesia where he was awakened in the operating type cart in stable condition.  Specimen: Left superficial lobe parotid   Dispo: Good plan will be to discharge to home follow-up in 48  hours for drain removal.  Plan:  Ice, elevation, analgesia, 48 hour suction drain   Roena Malady 07/14/2021 10:03 AM

## 2021-07-15 LAB — SURGICAL PATHOLOGY

## 2021-07-16 ENCOUNTER — Encounter: Payer: Self-pay | Admitting: Unknown Physician Specialty

## 2021-07-16 MED ORDER — HEMOSTATIC AGENTS (NO CHARGE) OPTIME
TOPICAL | Status: DC | PRN
  Administered 2021-07-14: 1 via TOPICAL

## 2021-10-31 ENCOUNTER — Ambulatory Visit: Payer: Self-pay

## 2023-03-02 IMAGING — CT CT NECK W/ CM
4 of 5 series · 14 of 33 positions shown, 16 images · IV contrast (omnipaque)
Comparison: None

CLINICAL DATA: Benign neoplasm parotid gland G66.H (MVL-5O-CM).
Additional history provided: Patient reports left neck swelling with
left submandibular "knot "present for 3-4 months, history of left
nephrectomy for RCC in 6727.

EXAM:
CT NECK WITH CONTRAST
TECHNIQUE: Multidetector CT imaging of the neck was performed using the
standard protocol following the bolus administration of intravenous
contrast.
CONTRAST:  75mL OMNIPAQUE IOHEXOL 350 MG/ML SOLN

[Series 3: axial bone neck 2.00 · axial · 0.66mm/px · z∈[-652,-526]mm · 3 of 127 slices shown]
[im 32/127  bone]
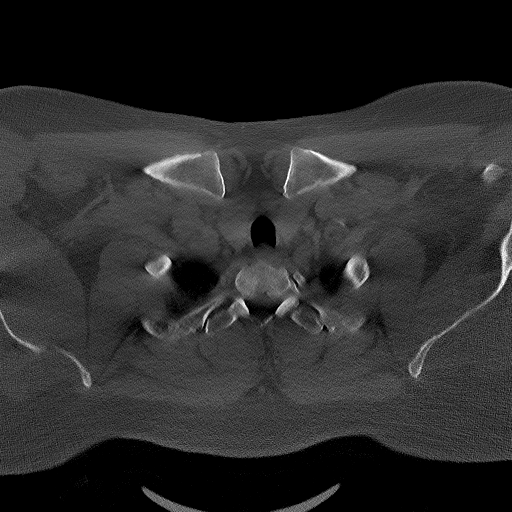
[im 64/127  bone]
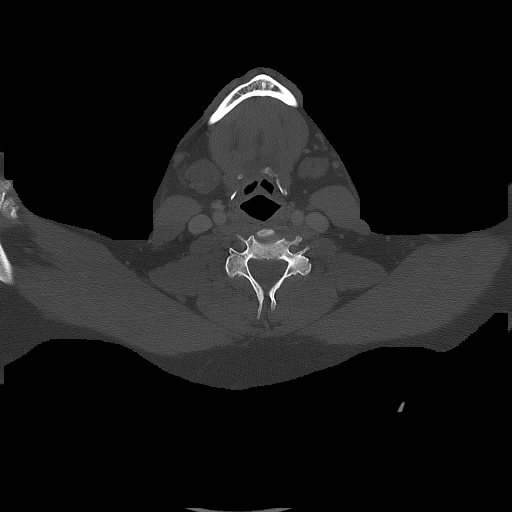
[im 95/127  bone]
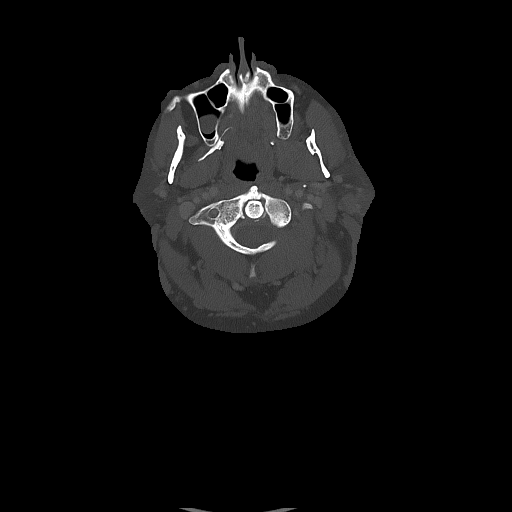

[Series 4: coronal neck neck (person_name) 2.00 cor · coronal · 0.52mm/px · 3 of 161 slices shown]
[im 33/161  bone]
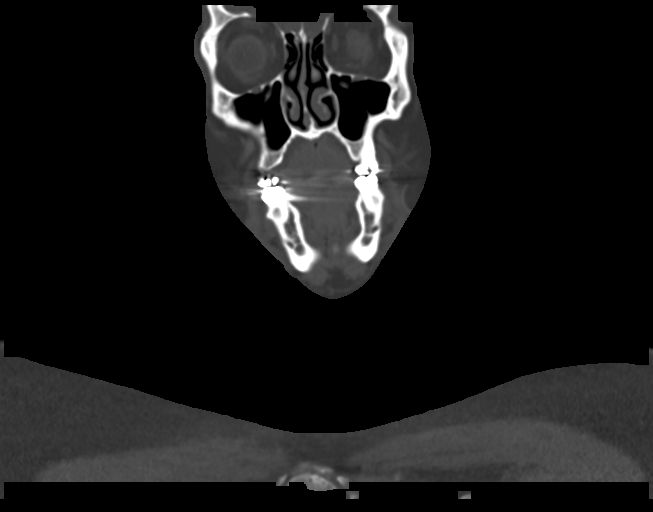
[im 65/161  bone]
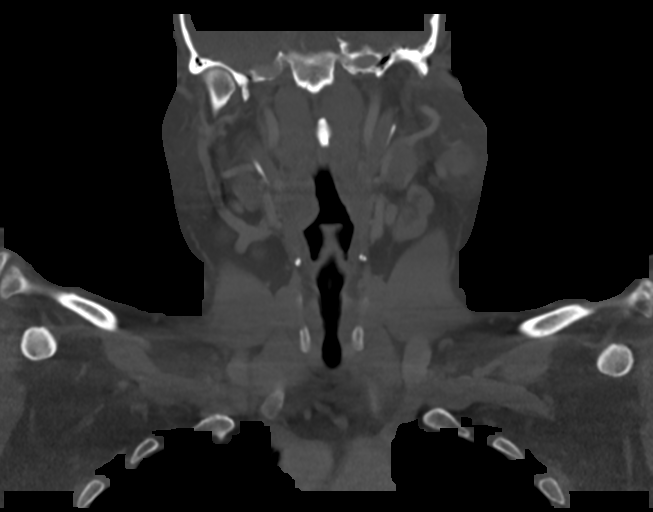
[im 97/161  bone]
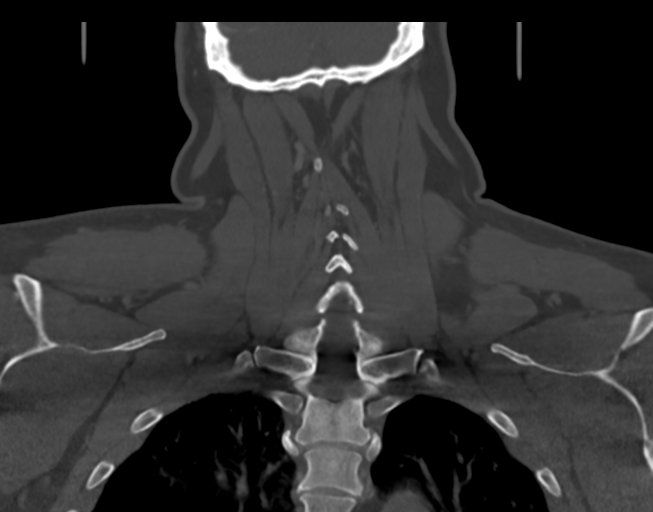

[Series 6: sagittal neck neck (person_name) 2.00 sag · sagittal · 0.52mm/px · 5 of 169 slices shown, 6 images]
[im 57/169  bone]
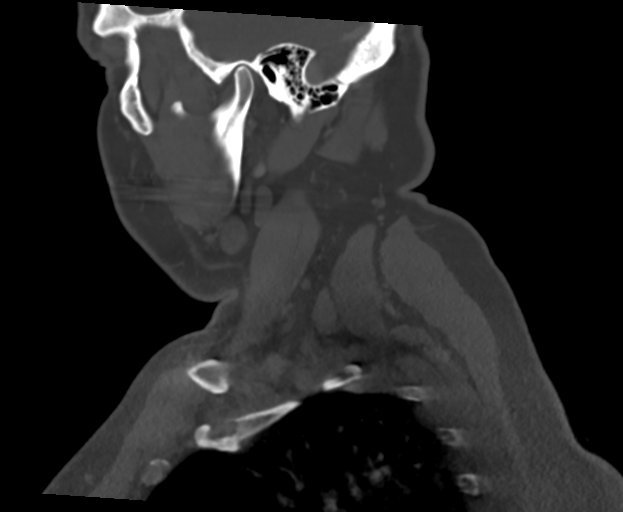
[im 71/169  bone]
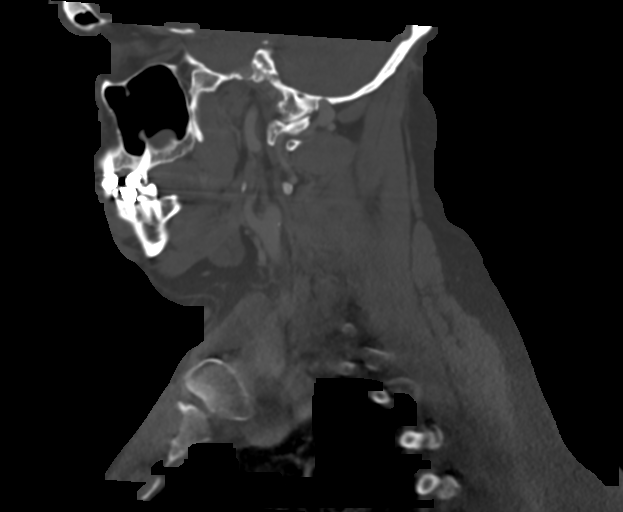
[im 85/169  soft-tissue]
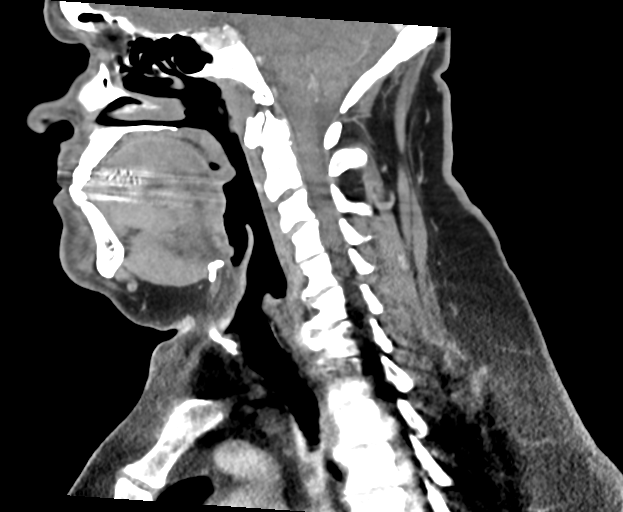
[im 85/169  bone]
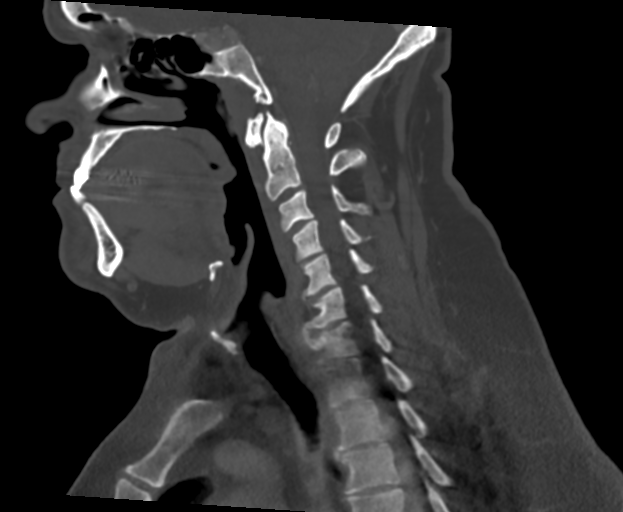
[im 99/169  bone]
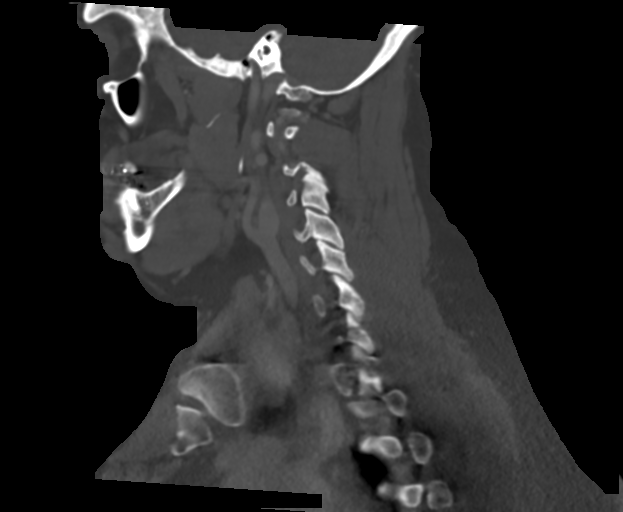
[im 113/169  bone]
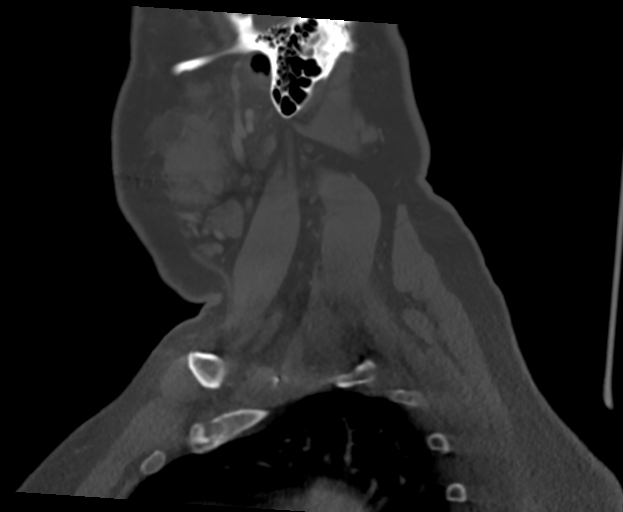

[Series 8: ax oropharynx neck neck (person_name) 2.00 ax · axial · 0.63mm/px · z∈[-661,-529]mm · 3 of 132 slices shown, 4 images]
[im 33/132  soft-tissue]
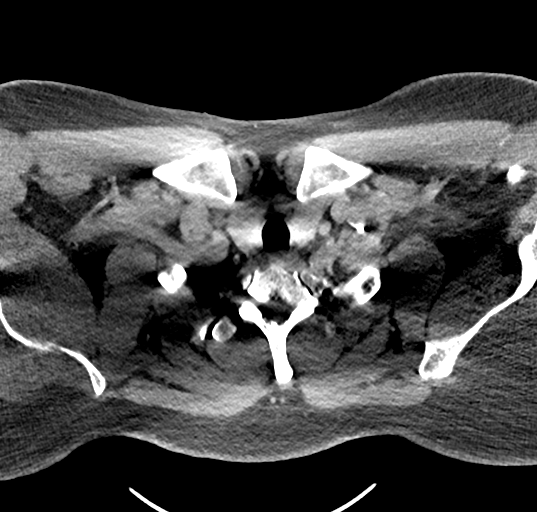
[im 33/132  bone]
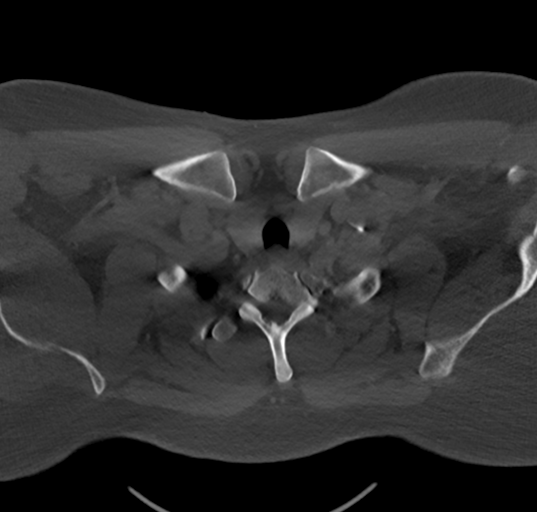
[im 66/132  bone]
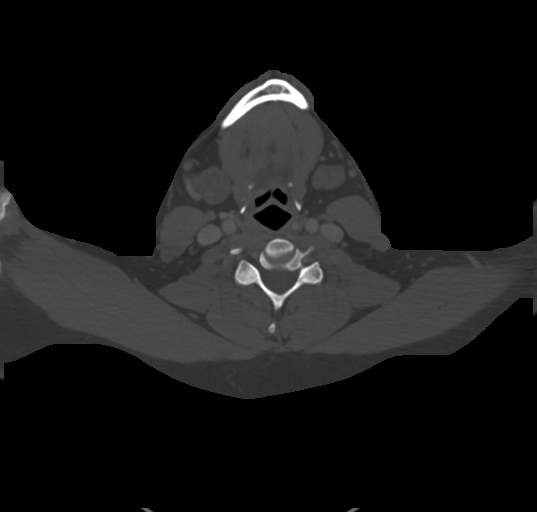
[im 99/132  bone]
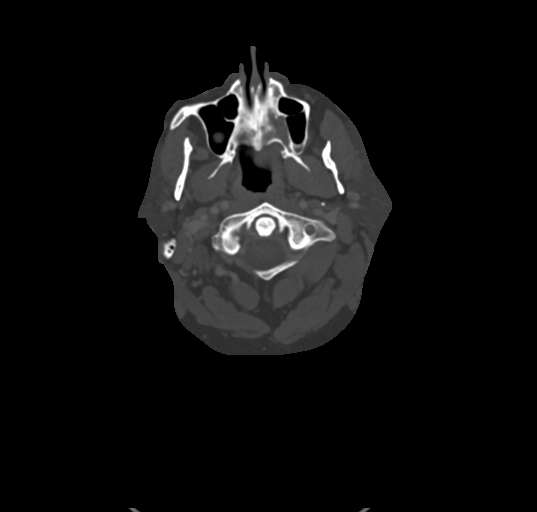

[14 of 33 positions shown; findings below may reference images not displayed]

FINDINGS: Pharynx and larynx: Streak and beam hardening artifact arising from
dental restoration partially obscures the oral cavity. No
appreciable swelling or discrete mass within the oral cavity,
pharynx or larynx. Postinflammatory calcifications within the
palatine tonsils bilaterally.

Salivary glands: Lobular somewhat heterogeneous mass within the
posterior aspect of the left parotid gland, measuring 3.0 x 2.3 x
3.0 cm, likely involving both the superficial and deep lobes (series
2, image 36) (series 6, image 121). 1 cm ovoid nodule within the
posterior aspect of the right parotid gland, which may reflect a
primary parotid neoplasm or nonspecific mildly enlarged lymph node.
The submandibular glands are unremarkable.

Thyroid: Unremarkable.

Lymph nodes: Possible enlarged lymph node within the right parotid
gland as described above. No pathologically enlarged cervical chain
lymph nodes are identified.

Vascular: The major vascular structures of the neck are patent.
Atherosclerotic plaque within the bilateral carotid arteries.

Limited intracranial: No evidence of acute intracranial abnormality
within the field of view.

Visualized orbits: No mass or acute finding.

Mastoids and visualized paranasal sinuses: Trace mucosal thickening
within the right frontal sinus. Trace mucosal thickening within the
bilateral ethmoid air cells. 14 mm right maxillary sinus mucous
retention cyst.

Skeleton: Cervical spondylosis. No acute bony abnormality or
aggressive osseous lesion.

Upper chest: No consolidation within the imaged lung apices.
IMPRESSION: 3.0 x 3.0 cm mass within the left parotid gland, as described and
compatible with the provided history of primary parotid neoplasm. An
abnormal, enlarged lymph node is also possible (but considered less
likely). Direct tissue sampling recommended, if not already
performed.

1 cm ovoid nodule within the posterior aspect of the right parotid
gland, which may reflect a primary parotid neoplasm or nonspecific
mildly enlarged lymph node. Consider direct tissue sampling.

Mild paranasal sinus disease, as described.

## 2023-03-18 IMAGING — US IR BIOPSY CORE SALIVARY GLAND
1 series · 11 of 11 positions shown · non-contrast
Comparison: none

INDICATION: 49-year-old with a left parotid lesion.  Tissue diagnosis is needed.

[Series 1: ir biopsy core salivary gland · 0.06mm/px · 11 of 11 slices shown]
[im 1/11]
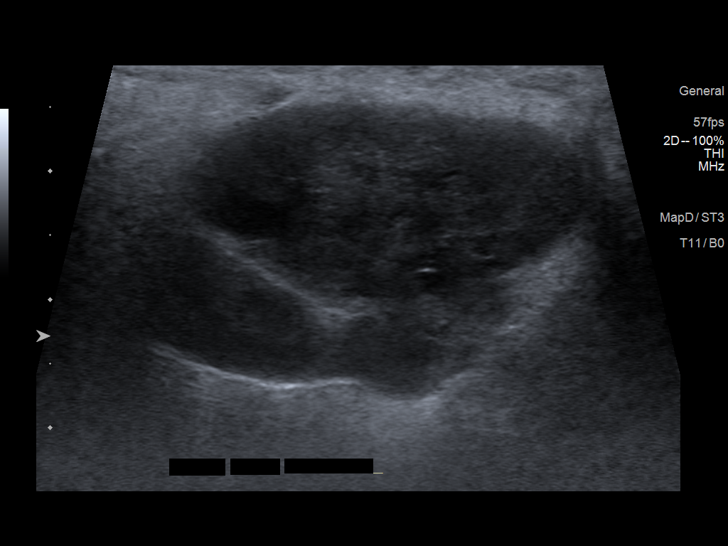
[im 2/11]
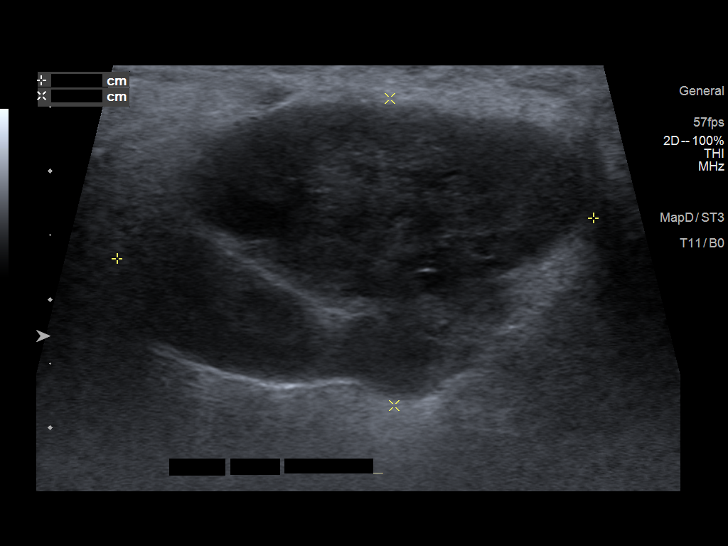
[im 3/11]
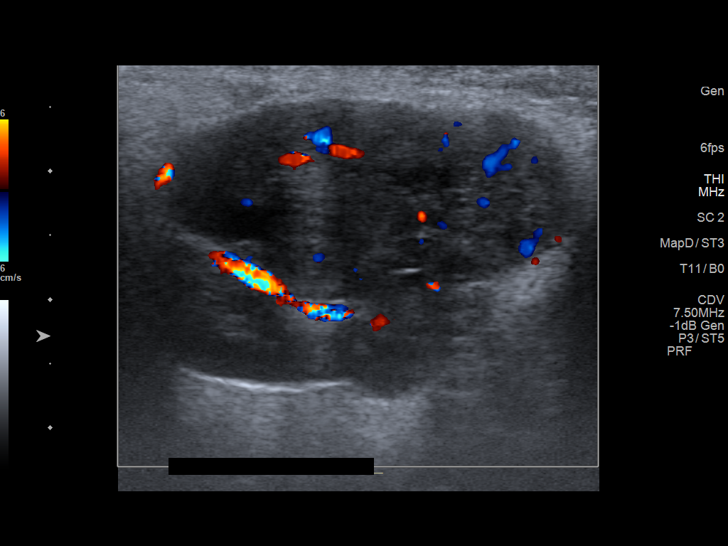
[im 4/11]
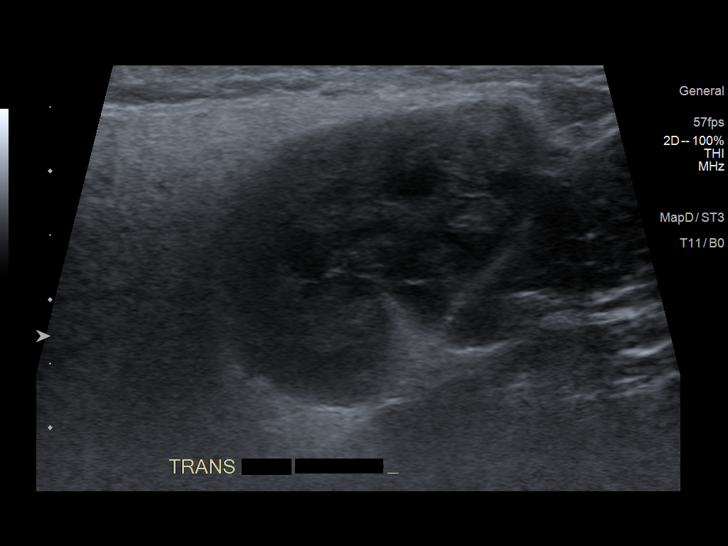
[im 5/11]
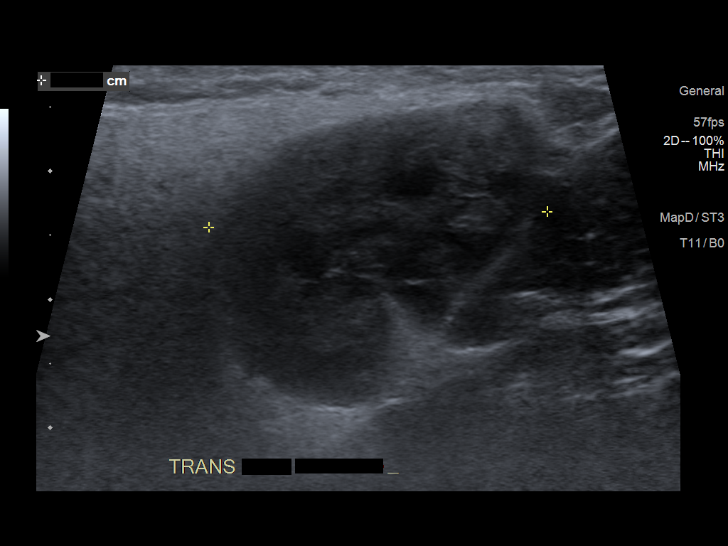
[im 6/11]
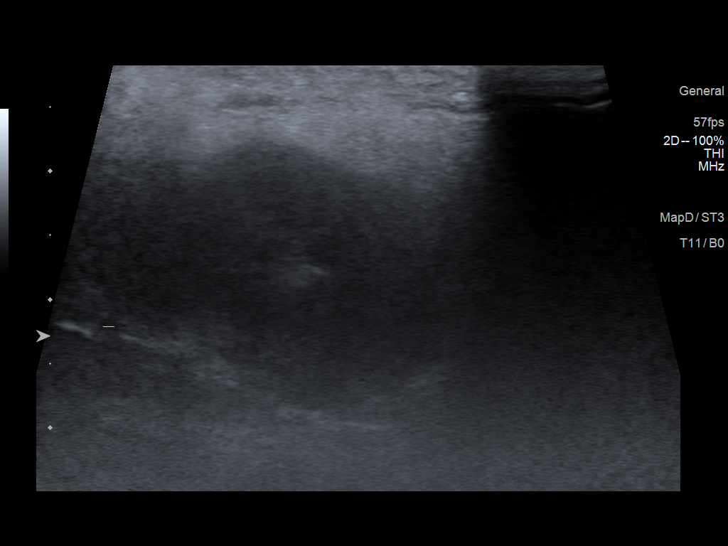
[im 7/11]
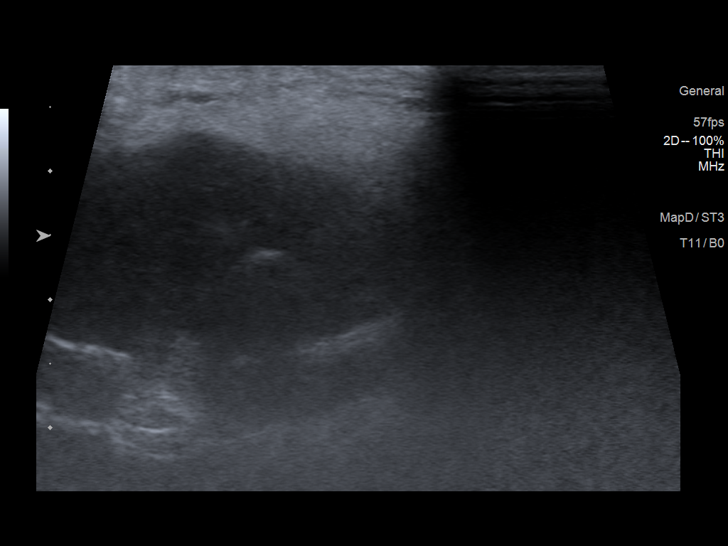
[im 8/11]
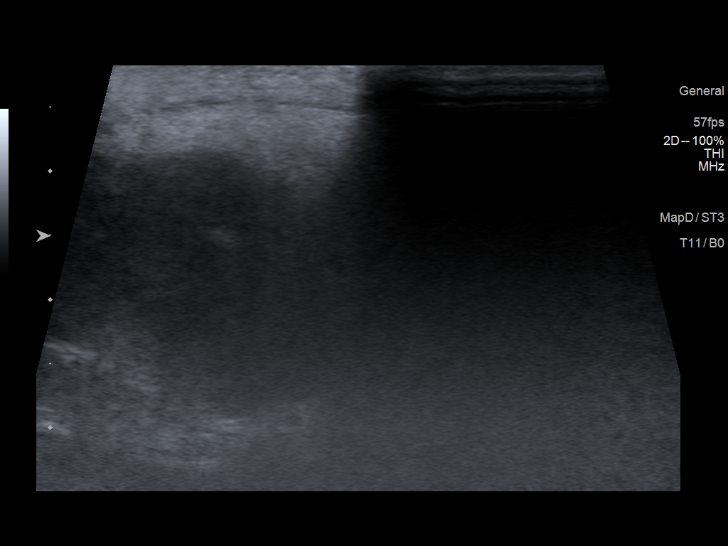
[im 9/11]
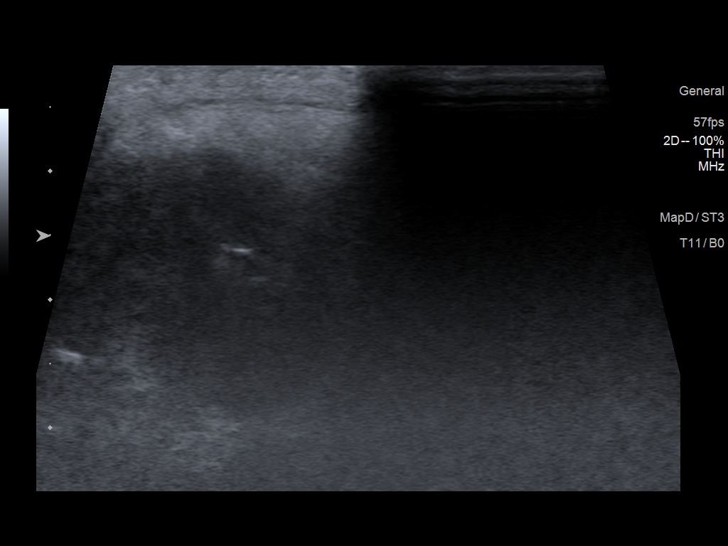
[im 10/11]
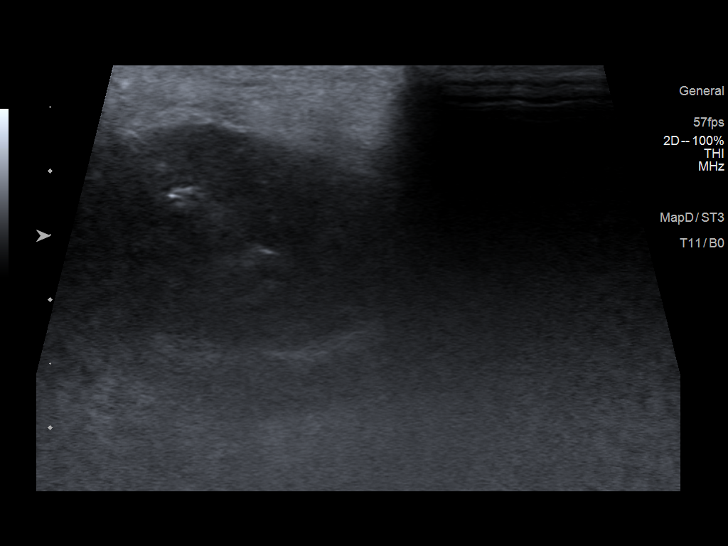
[im 11/11]
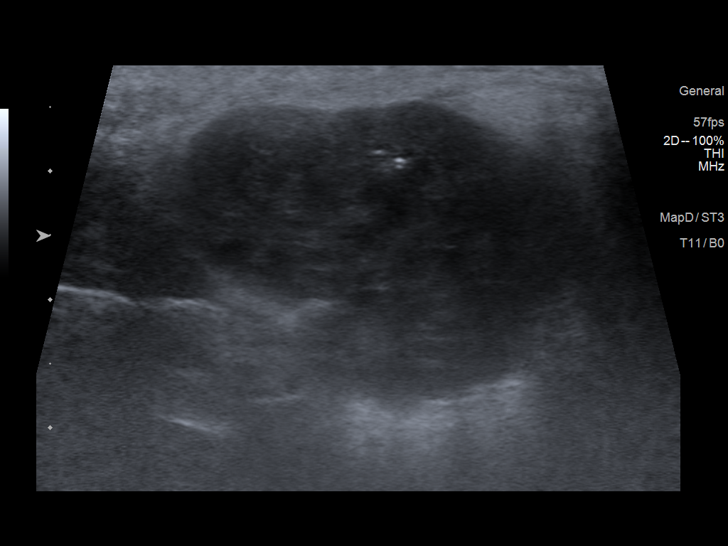

[11 of 11 positions shown; findings below may reference images not displayed]

EXAM:
ULTRASOUND-GUIDED CORE BIOPSY OF LEFT PAROTID LESION

MEDICATIONS:
Local anesthetic, 1% lidocaine

ANESTHESIA/SEDATION:
None

FLUOROSCOPY TIME:  None

COMPLICATIONS:
None immediate.

PROCEDURE:
Informed written consent was obtained from the patient after a
thorough discussion of the procedural risks, benefits and
alternatives. All questions were addressed. Time-out was performed.

Ultrasound was used to identify the left parotid lesion. The left
side of the neck was prepped with chlorhexidine and sterile field
was created. The skin was anesthetized using 1% lidocaine. Small
incision was made. Using ultrasound guidance, an 18 gauge Boyzz
Vidal core device was directed into the left parotid lesion. Total
of 4 core biopsies were obtained. Specimens placed in formalin.
Bandage placed over the puncture site.
FINDINGS: Hypoechoic lesion in left parotid gland. Findings correspond with
the recent CT findings. Biopsy needle was confirmed within the
lesion. No immediate bleeding or hematoma.
IMPRESSION: Ultrasound-guided core biopsy of the left parotid lesion.

## 2024-12-26 ENCOUNTER — Ambulatory Visit: Admitting: General Practice
